# Patient Record
Sex: Female | Born: 2005 | Race: White | Hispanic: No | Marital: Single | Smoking: Never smoker
Health system: Southern US, Community
[De-identification: ages and names within clinical notes are randomized; demographics above are authoritative.]

## PROBLEM LIST (undated history)

## (undated) DIAGNOSIS — F909 Attention-deficit hyperactivity disorder, unspecified type: Secondary | ICD-10-CM

## (undated) DIAGNOSIS — F32A Depression, unspecified: Secondary | ICD-10-CM

## (undated) DIAGNOSIS — F419 Anxiety disorder, unspecified: Secondary | ICD-10-CM

## (undated) HISTORY — PX: WISDOM TOOTH EXTRACTION: SHX21

---
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
## 2023-03-18 DIAGNOSIS — J020 Streptococcal pharyngitis: Secondary | ICD-10-CM | POA: Diagnosis not present

## 2023-06-05 DIAGNOSIS — H109 Unspecified conjunctivitis: Secondary | ICD-10-CM | POA: Diagnosis not present

## 2023-06-30 DIAGNOSIS — N946 Dysmenorrhea, unspecified: Secondary | ICD-10-CM | POA: Diagnosis not present

## 2023-06-30 DIAGNOSIS — N83202 Unspecified ovarian cyst, left side: Secondary | ICD-10-CM | POA: Diagnosis not present

## 2023-06-30 DIAGNOSIS — N83201 Unspecified ovarian cyst, right side: Secondary | ICD-10-CM | POA: Diagnosis not present

## 2023-06-30 DIAGNOSIS — N83291 Other ovarian cyst, right side: Secondary | ICD-10-CM | POA: Diagnosis not present

## 2023-06-30 DIAGNOSIS — N83292 Other ovarian cyst, left side: Secondary | ICD-10-CM | POA: Diagnosis not present

## 2023-06-30 DIAGNOSIS — R102 Pelvic and perineal pain: Secondary | ICD-10-CM | POA: Diagnosis not present

## 2023-07-23 DIAGNOSIS — R102 Pelvic and perineal pain: Secondary | ICD-10-CM | POA: Diagnosis not present

## 2023-07-23 DIAGNOSIS — N83201 Unspecified ovarian cyst, right side: Secondary | ICD-10-CM | POA: Diagnosis not present

## 2023-07-23 DIAGNOSIS — Z3169 Encounter for other general counseling and advice on procreation: Secondary | ICD-10-CM | POA: Diagnosis not present

## 2023-07-23 DIAGNOSIS — N83202 Unspecified ovarian cyst, left side: Secondary | ICD-10-CM | POA: Diagnosis not present

## 2023-08-31 DIAGNOSIS — J3089 Other allergic rhinitis: Secondary | ICD-10-CM | POA: Diagnosis not present

## 2023-08-31 DIAGNOSIS — H1013 Acute atopic conjunctivitis, bilateral: Secondary | ICD-10-CM | POA: Diagnosis not present

## 2023-08-31 DIAGNOSIS — R067 Sneezing: Secondary | ICD-10-CM | POA: Diagnosis not present

## 2023-09-30 DIAGNOSIS — N979 Female infertility, unspecified: Secondary | ICD-10-CM | POA: Diagnosis not present

## 2023-09-30 DIAGNOSIS — Z13220 Encounter for screening for lipoid disorders: Secondary | ICD-10-CM | POA: Diagnosis not present

## 2023-09-30 DIAGNOSIS — Z0000 Encounter for general adult medical examination without abnormal findings: Secondary | ICD-10-CM | POA: Diagnosis not present

## 2023-09-30 DIAGNOSIS — F419 Anxiety disorder, unspecified: Secondary | ICD-10-CM | POA: Diagnosis not present

## 2023-09-30 DIAGNOSIS — K5909 Other constipation: Secondary | ICD-10-CM | POA: Diagnosis not present

## 2023-09-30 DIAGNOSIS — Z131 Encounter for screening for diabetes mellitus: Secondary | ICD-10-CM | POA: Diagnosis not present

## 2023-09-30 DIAGNOSIS — Z1329 Encounter for screening for other suspected endocrine disorder: Secondary | ICD-10-CM | POA: Diagnosis not present

## 2023-10-15 DIAGNOSIS — J3489 Other specified disorders of nose and nasal sinuses: Secondary | ICD-10-CM | POA: Diagnosis not present

## 2023-10-15 DIAGNOSIS — F419 Anxiety disorder, unspecified: Secondary | ICD-10-CM | POA: Diagnosis not present

## 2023-10-15 DIAGNOSIS — Z30011 Encounter for initial prescription of contraceptive pills: Secondary | ICD-10-CM | POA: Diagnosis not present

## 2023-10-15 DIAGNOSIS — J029 Acute pharyngitis, unspecified: Secondary | ICD-10-CM | POA: Diagnosis not present

## 2023-10-15 DIAGNOSIS — N920 Excessive and frequent menstruation with regular cycle: Secondary | ICD-10-CM | POA: Diagnosis not present

## 2023-10-15 DIAGNOSIS — R051 Acute cough: Secondary | ICD-10-CM | POA: Diagnosis not present

## 2023-10-15 DIAGNOSIS — Z1331 Encounter for screening for depression: Secondary | ICD-10-CM | POA: Diagnosis not present

## 2023-11-20 DIAGNOSIS — N289 Disorder of kidney and ureter, unspecified: Secondary | ICD-10-CM | POA: Diagnosis not present

## 2023-11-20 DIAGNOSIS — N390 Urinary tract infection, site not specified: Secondary | ICD-10-CM | POA: Diagnosis not present

## 2023-11-20 DIAGNOSIS — R350 Frequency of micturition: Secondary | ICD-10-CM | POA: Diagnosis not present

## 2023-11-20 DIAGNOSIS — E876 Hypokalemia: Secondary | ICD-10-CM | POA: Diagnosis not present

## 2023-11-20 DIAGNOSIS — R319 Hematuria, unspecified: Secondary | ICD-10-CM | POA: Diagnosis not present
